# Patient Record
Sex: Male | Born: 2005 | Race: Black or African American | Hispanic: No | Marital: Single | Smoking: Never smoker
Health system: Southern US, Community
[De-identification: ages and names within clinical notes are randomized; demographics above are authoritative.]

## PROBLEM LIST (undated history)

## (undated) DIAGNOSIS — L309 Dermatitis, unspecified: Secondary | ICD-10-CM

## (undated) DIAGNOSIS — J45909 Unspecified asthma, uncomplicated: Secondary | ICD-10-CM

## (undated) DIAGNOSIS — T50901A Poisoning by unspecified drugs, medicaments and biological substances, accidental (unintentional), initial encounter: Secondary | ICD-10-CM

## (undated) DIAGNOSIS — F209 Schizophrenia, unspecified: Secondary | ICD-10-CM

## (undated) HISTORY — DX: Poisoning by unspecified drugs, medicaments and biological substances, accidental (unintentional), initial encounter: T50.901A

## (undated) HISTORY — DX: Schizophrenia, unspecified: F20.9

---
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
## 2024-01-04 ENCOUNTER — Encounter: Payer: Self-pay | Admitting: Child and Adolescent Psychiatry

## 2024-01-04 ENCOUNTER — Ambulatory Visit (INDEPENDENT_AMBULATORY_CARE_PROVIDER_SITE_OTHER): Payer: MEDICAID | Admitting: Child and Adolescent Psychiatry

## 2024-01-04 ENCOUNTER — Other Ambulatory Visit: Payer: Self-pay

## 2024-01-04 DIAGNOSIS — F209 Schizophrenia, unspecified: Secondary | ICD-10-CM

## 2024-01-04 MED ORDER — BENZTROPINE MESYLATE 0.5 MG PO TABS
ORAL_TABLET | ORAL | 1 refills | Status: DC
Start: 2024-01-04 — End: 2024-02-16

## 2024-01-04 MED ORDER — HYDROXYZINE PAMOATE 25 MG PO CAPS: 25.0000 mg | ORAL_CAPSULE | Freq: Every day | ORAL | 1 refills | Status: DC

## 2024-01-04 MED ORDER — RISPERIDONE 3 MG PO TABS: ORAL_TABLET | ORAL | 1 refills | Status: DC

## 2024-01-04 NOTE — Progress Notes (Signed)
 [facility] MD/PA/NP OP Progress Note  01/04/24 3:30 PM Peter Ross  MRN:  [id number]  Chief Complaint: Medication management follow-up. Chief Complaint   Follow-up      HPI:   This is an 18 year old male, domiciled with biological mother, younger sibling and aunt; 12th grader; with medical history significant of bronchial asthma and bronchitis; and psychiatric history Cannabis abuse and psychiatric diagnosis of Schizophrenia, and hx of previous legal charges, presented today to for medication management follow-up and was accompanied with his mother.  Previously per court mandated psychological eval he was diagnosed with schizophreniform disorder and cannabis abuse in 11/2020. He was in juvenile detention facility from May to October and subsequently seen in ER for medication refills, referred to this clinic for psychiatric medication management in 06/2022.   After his appointment in 07/2021,  court ordered for capacity restoration for all his charges that occurred in 2022, subsequently mother brought pt to [facility] per court order.  He was subsequently diagnosed with schizophrenia unspecified type.  During the hospitalization at [facility] he has medications were titrated up, he was discharged on Risperdal  3 mg in the morning and 5 mg at night(increased on September 24, 2021), hydroxyzine  50 mg twice a day, Cogentin  0.5 mg twice a day started on May 18/2023, and BuSpar  10 mg twice a day decreased on August 22, 2021.  He is currently prescribed Risperdal  3 mg twice daily, Cogentin  0.5 mg twice daily and hydroxyzine  25 mg daily at bedtime.  Today he was accompanied by his program and was evaluated alone and jointly with his girlfriend.  He provided verbal informed consent to speak with his cousin to obtain collateral information and discuss treatment plan.  Dontre denied any new concerns for today's appointment, he reported that he has been doing good, graduated high school and now working 38 hours a week.   He reported that work has been going well, he feels energetic in the morning however with his medication he does get tired.  He reported that they ran out of the medications for about 2 or 3 days and his mother complained that he was not acting like when he is on medications, patient reported that he did not see any difference however has been back on the medication and has been taking it consistently.  Psychoeducation was provided on medication adherence and he was receptive to this.  He denied any problems with his mood, denied AVH, did not admit any delusions, denied SI or HI and reported that he likes his work, and in his free time he listens to music for the laxatives at home.  He denied any substance abuse.  His cousin reported that overall he has been doing well, his mother commented that he was not acting as usual when he was off the medications, they quickly realized it and he understands to take his medications daily.  We discussed to continue with current medications because of the stability with his symptoms.  His last ANC was 1000 last month, it has been overall stable ranging from 1000-1300, we will consider ordering it at the next appointment.   We also discussed, that I will be transitioning from the clinic, and will only have one day at the clinic, therefore it will not be possible for me to continue to see them frequently, gave a back up appointment and reached out to [facility] clinic to see if he can be transitioned there.  Discussed this with patient and they verbalized understanding and agreed with this.  Visit Diagnosis:    ICD-10-CM   1. Schizophrenia, unspecified type (HCC)  F20.9 benztropine  (COGENTIN ) 0.5 MG tablet    risperiDONE  (RISPERDAL ) 3 MG tablet              Past Psychiatric History:   He had numerous ER visit before April of 2022.  No previous outpatient psychiatric treatment. He received psychiatric treatment while he was in juvenile detention center  according to mother and was diagnosed and treated for schizophrenia by a psychiatrist according to mother. He was admitted to [facility] between February 2023 to June 2023 for a total of 5 months for capacity restoration ordered by court, subsequently was discharged as all the charges were dropped. He has tried haloperidol 1 mg along with Risperdal , was discontinued because of dropping absolute neutrophil count.  He also tried Thorazine 50 mg along with Risperdal  and was also discontinued because of drop in absolute neutrophil count. They also had concerns of benign ethnic neutropenia for patient while he was at [facility].  Before initiating Risperdal  in 10/2020 during his ER visit at [facility] his ANC was 1.4K and 1.3K. Records from [facility] suggest that on admission prior to starting risperdal   On 02/03 ANC was 1.2 On 04/04 1.09 on a higher dose of Risperdal  04/11 - 1.14 05/02 - 0.95 on thorazine 05/04 - 0.85 stopped thorazine 05/08 - 1.86 05/30 - 0.77(Haldol was started on 05/18 and therefore it was stopped on 05/30 06/01 - 1.5 05/14/2022 - 1.1 06/30/22 - 1.5 10/27/21 - 1.4 with WBC of 3.7 08/06 - WBC of 2.9 with ANC of 900 08/08 - WBC of 3.4 with ANC of 1.3 09/11 - WBC of 3.2 with ANC of 1.0 10/03 -  WBC of 3.6 and ANC of 1.1 1/28 - WBC of 3.4 and ANC of 1.3 11/24/23 - WBC of 3.1 and ANC of 1.0   I previously discussed these findings with mother and dicussed with mother that 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  We discussed to continue to monitor, repeat CBC intermittently.  He has seen primary care previously.   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 No pertinent surgical history.  Family Psychiatric History:   Mother reports that father's side of the family has history significant of psychosis. Mother reports that from her side of the family, history significant of depression.  Family History:  Family History  Problem Relation Age of Onset   Asthma Mother     Social History:  Social History   Socioeconomic History   Marital status: Single    Spouse name: Not on file   Number of children: Not on file   Years of education: Not on file   Highest education level: 12th grade  Occupational History   Occupation: Production manager  Tobacco Use   Smoking status: Never    Passive exposure: Yes   Smokeless tobacco: Never  Vaping Use   Vaping status: Never Used  Substance and Sexual Activity   Alcohol use: Never   Drug use: Never   Sexual activity: Never  Other Topics Concern  Not on file  Social History Narrative   ** Merged History Encounter **       Social Drivers of Health   Financial Resource Strain: Not on File (03/31/2023)   Received from [employer]    Financial Resource Strain: 0  Food Insecurity: Not at Risk (05/11/2023)   Received from [employer]    Food: 1  Transportation Needs: Not at Risk (05/11/2023)   Received from [employer] Needs    Transportation: 1  Physical Activity: Not on File (03/31/2023)   Received from [facility]   Physical Activity    Physical Activity: 0  Stress: Not on File (03/31/2023)   Received from [facility]   Stress    Stress: 0  Social Connections: Not on File (03/31/2023)   Received from [facility]   Social Connections    Connectedness: 0    Allergies:  Allergies  Allergen Reactions   Shellfish Allergy Hives, Itching and Swelling    Metabolic Disorder Labs: Lab Results  Component Value Date   HGBA1C 5.4 11/23/2023   MPG 111.15 10/30/2020   Lab Results  Component Value Date   PROLACTIN  25.2 11/23/2023   PROLACTIN 34.0 (H) 02/17/2023   Lab Results  Component Value Date   CHOL 129 11/23/2023   TRIG 27 11/23/2023   HDL 65 11/23/2023   CHOLHDL 2.0 11/23/2023   VLDL 5 10/30/2020   LDLCALC 56 11/23/2023   LDLCALC 54 02/17/2023   Lab Results  Component Value Date   TSH 0.936 05/14/2022   TSH 0.603 10/30/2020    Therapeutic Level Labs: No results found for: LITHIUM No results found for: VALPROATE No results found for: CBMZ  Current Medications: Current Outpatient Medications  Medication Sig Dispense Refill   benztropine  (COGENTIN ) 0.5 MG tablet TAKE 1 TABLET(0.5 MG) BY MOUTH TWICE DAILY 180 tablet 1   fluticasone (FLONASE) 50 MCG/ACT nasal spray Place 1 spray into both nostrils daily.     hydrOXYzine  (VISTARIL ) 25 MG capsule Take 1 capsule (25 mg total) by mouth at bedtime. 90 capsule 1   risperiDONE  (RISPERDAL ) 3 MG tablet TAKE 1 TABLET(3 MG) BY MOUTH TWICE DAILY 180 tablet 1   No current facility-administered medications for this visit.     Musculoskeletal:  Gait & Station: normal Patient leans: N/A  Psychiatric Specialty Exam: Review of Systems  Blood pressure 113/73, pulse (!) 57, temperature (!) 97.5 F (36.4 C), temperature source Temporal, height 5' 9.5 (1.765 m), weight 149 lb 9.6 oz (67.9 kg).Body mass index is 21.78 kg/m.  General Appearance: Casual and Fairly Groomed  Eye Contact:  Fair  Speech:  Clear and Coherent and Normal Rate  Volume:  Normal  Mood:  good..  Affect:  Appropriate, Congruent, and Full Range   Thought Process:  Goal Directed and Linear  Orientation:  Full (Time, Place, and Person)  Thought Content: Logical   Suicidal Thoughts:  No  Homicidal Thoughts:  No  Memory:  Immediate;   Fair Recent;   Fair Remote;   Fair  Judgement:  Fair  Insight:  Fair  Psychomotor Activity:  Normal  Concentration:  Concentration: Fair and Attention Span: Fair  Recall:  [employer] of Knowledge: Fair  Language: Fair   Akathisia:  No    AIMS (if indicated): done on 10/08/23 and negative  Assets:  Communication Skills Desire for Improvement Financial Resources/Insurance Housing Leisure Time Physical Health Social Support Transportation Vocational/Educational  ADL's:  Intact  Cognition:  WNL  Sleep:   Fair   Screenings: GAD-7    Flowsheet Row Office Visit from 06/30/2022 in [facility] Office Visit from 04/02/2022 in [facility] Office Visit from 02/20/2022 in [facility]  Total GAD-7 Score 0 2 1   PHQ2-9    Flowsheet Row Office Visit from 09/08/2023 in [facility] ED from 09/24/2022 in [facility] Office Visit from 06/30/2022 in [facility] Office Visit from 04/02/2022 in [facility] Office Visit from 02/20/2022 in [facility]  PHQ-2 Total Score 1 1 0 0 5  PHQ-9 Total Score -- -- 0 0 11   Flowsheet Row ED from 09/24/2022 in [facility] Office Visit from 04/02/2022 in [facility] ED from 10/30/2020 in [facility]  C-SSRS RISK CATEGORY High Risk No Risk Moderate Risk     Assessment and Plan:   18 year old male with prior psychiatric history of Schizophrenia and 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     His mother describes him as a typically growing child until about 1 to 2 years ago when she started noticing change in his behaviors such as becoming agitated, physically aggressive towards her, elopement from home, complaining about hearing voices and seeing things.  His presentation appeared most likely consistent with schizophrenia in the context of genetic predisposition and his past  use of Cannabis.   He was discharged on Risperdal  3 mg twice a day, BuSpar  30 mg twice a day, hydroxyzine  50 mg 3 times a day with overall stability in his symptoms.  At his initial evaluation they ran out of the medication for about 7 days therefore he was recommended to start Risperdal  at 1.5 mg twice a day and BuSpar  15 mg twice a day with a plan to go back to his previous dose of Risperdal  3 mg twice a day and BuSpar  30 mg twice a day.  However mother decided to keep him on Risperdal  1.5 mg twice a day and BuSpar  15 mg twice a day because of excessive sedation on subsequent follow up. He was then admitted to [facility] for capacity restoration where he was stabilized with Risperdal  3 mg in AM and 5 mg at bedtime, Buspar  10 mg BID, Atarax  50 mg BID and Cogentin  0.5 mg BID.  Over the time here is Risperdal  was decreased to 3 mg twice daily, discontinued BuSpar , hydroxyzine  was decreased to 25 mg at night and Cogentin  was continued with 0.5 mg twice daily.  Update on 01/04/24 -reviewed response to his current medications and he appears to have continued stability with his psychiatric symptoms.  He did not notice any worsening of anxiety since discontinuation of BuSpar , continues to do well with Risperdal  and Cogentin .    Plan is mentioned below.   1. Schizophrenia, unspecified type (HCC) - Continue with Risperdal  3 mg twice daily .  - Take Cogentin (Benzatropine) 0.5 mg twice a day   2. Anxiety disorder, unspecified type - Continue with atarax  25 mg daily at bedtime.   -from 11/24/23 - CBC within normal limits except WBC of 3.1 and ANC of 1.0, CMP within normal limits, hemoglobin A1c is 5.4, lipid panel within normal limit         This note was generated in part or whole with voice recognition software. Voice  recognition is usually quite accurate but there are transcription errors that can and very often do occur. I apologize for any typographical errors that were not detected and  corrected.           Shelton CHRISTELLA Marek, MD 01/04/2024, 3:09 PM

## 2024-02-16 ENCOUNTER — Other Ambulatory Visit: Payer: Self-pay | Admitting: Child and Adolescent Psychiatry

## 2024-02-16 DIAGNOSIS — F209 Schizophrenia, unspecified: Secondary | ICD-10-CM

## 2024-02-23 ENCOUNTER — Ambulatory Visit (HOSPITAL_COMMUNITY): Payer: MEDICAID | Admitting: Psychiatry

## 2024-03-01 ENCOUNTER — Ambulatory Visit (HOSPITAL_COMMUNITY): Payer: MEDICAID | Admitting: Psychiatry

## 2024-03-15 ENCOUNTER — Ambulatory Visit (HOSPITAL_COMMUNITY): Payer: MEDICAID | Admitting: Psychiatry

## 2024-04-12 ENCOUNTER — Ambulatory Visit (INDEPENDENT_AMBULATORY_CARE_PROVIDER_SITE_OTHER): Payer: MEDICAID | Admitting: Psychiatry

## 2024-04-12 ENCOUNTER — Encounter (HOSPITAL_COMMUNITY): Payer: Self-pay | Admitting: Psychiatry

## 2024-04-12 VITALS — BP 110/68 | HR 64 | Ht 69.0 in | Wt 148.8 lb

## 2024-04-12 DIAGNOSIS — F209 Schizophrenia, unspecified: Secondary | ICD-10-CM

## 2024-04-12 DIAGNOSIS — F1291 Cannabis use, unspecified, in remission: Secondary | ICD-10-CM | POA: Diagnosis not present

## 2024-04-12 DIAGNOSIS — F418 Other specified anxiety disorders: Secondary | ICD-10-CM

## 2024-04-12 MED ORDER — HYDROXYZINE PAMOATE 25 MG PO CAPS: 25.0000 mg | ORAL_CAPSULE | Freq: Every day | ORAL | 0 refills | Status: DC

## 2024-04-12 NOTE — Progress Notes (Signed)
 [facility] MD/PA/NP OP Progress Note  04/12/2024 11:20 AM Peter Ross  MRN:  [id number]  Chief Complaint:  Chief Complaint  Patient presents with   Establish Care    Transfer care , schizophrenia   HPI: Patient being transferred from Dr. Susen. Chart reviewed and as per review  This is an 18 year old male, domiciled with biological mother, younger sibling and aunt; 12th grader; with medical history significant of bronchial asthma and bronchitis; and psychiatric history Cannabis abuse and psychiatric diagnosis of Schizophrenia, and hx of previous legal charges, presented today to for medication management follow-up and was accompanied with his mother.   Previously per court mandated psychological eval he was diagnosed with schizophreniform disorder and cannabis abuse in 11/2020. He was in juvenile detention facility from May to October and subsequently seen in ER for medication refills, referred to this clinic for psychiatric medication management in 06/2022.    Court ordered for capacity restoration for all his charges that occurred in 2022, subsequently mother brought pt to [facility] per court order.  He was subsequently diagnosed with schizophrenia unspecified type.  During the hospitalization at [facility] he has medications were titrated up, he was discharged on Risperdal  3 mg in the morning and 5 mg at night(increased on September 24, 2021), hydroxyzine  50 mg twice a day, Cogentin  0.5 mg twice a day started on May 18/2023, and BuSpar  10 mg twice a day decreased on August 22, 2021.   He is currently prescribed Risperdal  3 mg twice daily, Cogentin  0.5 mg twice daily and hydroxyzine  25 mg daily at bedtime He was admitted to [facility] between February 2023 to June 2023 for a total of 5 months for capacity restoration ordered by court, subsequently was discharged as all the charges were dropped. He has tried haloperidol 1 mg along with Risperdal , was discontinued because of dropping absolute neutrophil count.  He also  tried Thorazine 50 mg along with Risperdal  and was also discontinued because of drop in absolute neutrophil count. 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  labs with WBC done and in chart  08/06 - WBC of 2.9 with ANC of 900 08/08 - WBC of 3.4 with ANC of 1.3 09/11 - WBC of 3.2 with ANC of 1.0 10/03 -  WBC of 3.6 and ANC of 1.1 1/28 - WBC of 3.4 and ANC of 1.3 11/24/23 - WBC of 3.1 and ANC of 1.0    As per review His mother describes him as a typically growing child until about 1 to 2 years ago when she started noticing change in his behaviors such as becoming agitated, physically aggressive towards her, elopement from home, complaining about hearing voices and seeing things.  His presentation appeared most likely consistent with schizophrenia in the context of genetic predisposition and his past use of Cannabis   On evaluation today patient is doing reasonable he is not paranoid sitting calmly answering questions he has started work 30+ hours.  He has finished high school.  Describes not to be hallucinating does not endorse hallucinations or confusion he is not agitated does not endorse thoughts about harming himself or anybody else.  He believes the medication helping him there is no reported side effects no tremors or involuntary movements are noticeable.  Case discussed with mom and mom agrees with the treatment plan of continuing the current medication but adding something for sleep he has some difficulty sleeping or maintaining sleep despite being on respite all at night 3  mg. Patient does not endorse excessive anxiety he has regular anxiety that he feels is adequately controlled with hydroxyzine   He is also taking Cogentin  twice a day for possible work to prevent any side effects  There is no associated manic symptoms or panic attacks He describes his depression to be stable does not endorse depression or sadness on a day-to-day basis  He has  not relapsed and does not endorse any craving and is following up with compliance with the current medication regimen has a good support system Aggravating factors; past use of marijuana and admission.  Modifying factors supportive mom, current work  Severity improving  Duration more than 3 years  Current drug use denies for the last 2+ years    Visit Diagnosis:    ICD-10-CM   1. Schizophrenia, unspecified type (HCC)  F20.9     2. Other specified anxiety disorders  F41.8     3. 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 No pertinent surgical history.  Family Psychiatric History: see chart   Family History:  Family History  Problem Relation Age of Onset   Asthma Mother     Social History:  Social History   Socioeconomic History   Marital status: Single    Spouse name: Not on file   Number of children: Not on file   Years of education: Not on file   Highest education level: 12th grade  Occupational History   Occupation: Production manager  Tobacco Use   Smoking status: Never    Passive exposure: Yes   Smokeless tobacco: Never  Vaping Use   Vaping status: Never Used  Substance and Sexual Activity   Alcohol use: Never   Drug use: Never   Sexual activity: Never  Other Topics Concern   Not on file  Social History Narrative   ** Merged History Encounter **       Social Drivers of Health   Financial Resource Strain: Not on File (03/31/2023)   Received from [employer]    Financial  Resource Strain: 0  Food Insecurity: Not at Risk (05/11/2023)   Received from [facility] Insecurity    Within the past 12 months, the food you bought just didn't last and you didn't have enough money to get more.: 1  Transportation Needs: Not at Risk (05/11/2023)   Received from [employer] Needs    In the past 12 months, has lack of transportation kept you from medical appointments, meetings, work or from getting things needed for daily living? (Check all that apply): 1  Physical Activity: Not on File (03/31/2023)   Received from [facility]   Physical Activity    Physical Activity: 0  Stress: Not on File (03/31/2023)   Received from [facility]   Stress    Stress: 0  Social Connections: Not on File (03/31/2023)   Received from [facility]   Social Connections    Connectedness: 0    Allergies:  Allergies  Allergen  Reactions   Shellfish Allergy Hives, Itching and Swelling    Metabolic Disorder Labs: Lab Results  Component Value Date   HGBA1C 5.4 11/23/2023   MPG 111.15 10/30/2020   Lab Results  Component Value Date   PROLACTIN 25.2 11/23/2023   PROLACTIN 34.0 (H) 02/17/2023   Lab Results  Component Value Date   CHOL 129 11/23/2023   TRIG 27 11/23/2023   HDL 65 11/23/2023   CHOLHDL 2.0 11/23/2023   VLDL 5 10/30/2020   LDLCALC 56 11/23/2023   LDLCALC 54 02/17/2023   Lab Results  Component Value Date   TSH 0.936 05/14/2022   TSH 0.603 10/30/2020    Therapeutic Level Labs: No results found for: LITHIUM No results found for: VALPROATE No results found for: CBMZ  Current Medications: Current Outpatient Medications  Medication Sig Dispense Refill   benztropine  (COGENTIN ) 0.5 MG tablet TAKE 1 TABLET(0.5 MG) BY MOUTH TWICE DAILY 180 tablet 1   fluticasone (FLONASE) 50 MCG/ACT nasal spray Place 1 spray into both nostrils daily.     hydrOXYzine  (VISTARIL ) 25 MG capsule Take 1 capsule (25 mg total) by mouth at bedtime. (Patient taking differently: Take 25 mg by  mouth 2 (two) times daily.) 90 capsule 1   risperiDONE  (RISPERDAL ) 3 MG tablet TAKE 1 TABLET(3 MG) BY MOUTH TWICE DAILY 180 tablet 1   No current facility-administered medications for this visit.     Psychiatric Specialty Exam: Review of Systems  Cardiovascular:  Negative for chest pain.  Neurological:  Negative for tremors.  Psychiatric/Behavioral:  Positive for sleep disturbance. Negative for suicidal ideas.     Blood pressure 110/68, pulse 64, height 5' 9 (1.753 m), weight 148 lb 12.8 oz (67.5 kg), SpO2 99%.Body mass index is 21.97 kg/m.  General Appearance: Casual  Eye Contact:  Fair  Speech:  Clear and Coherent  Volume:  Decreased  Mood:  Euthymic  Affect:  Congruent  Thought Process:  Goal Directed  Orientation:  Full (Time, Place, and Person)  Thought Content: Logical   Suicidal Thoughts:  No  Homicidal Thoughts:  No  Memory:  Immediate;   Fair  Judgement:  Fair  Insight:  Shallow  Psychomotor Activity:  Normal  Concentration:  Concentration: Fair  Recall:  Fair  Fund of Knowledge: Good  Language: Good  Akathisia:  No  Handed:    AIMS (if indicated): no involuntary movements  Assets:  Desire for Improvement Physical Health Social Support  ADL's:  Intact  Cognition: WNL  Sleep:  variable    Screenings: GAD-7    Loss adjuster, chartered Office Visit from 04/12/2024 in [facility] Office Visit from 06/30/2022 in [facility] Office Visit from 04/02/2022 in [facility] Office Visit from 02/20/2022 in [facility]  Total GAD-7 Score 1 0 2 1   PHQ2-9    Flowsheet Row Office Visit from 04/12/2024 in [facility] Office Visit from 09/08/2023 in [facility] [place] ED from 09/24/2022 in [facility]  Office Visit from 06/30/2022 in [facility] Office Visit from 04/02/2022 in [facility]  PHQ-2 Total Score 0 1 1 0 0  PHQ-9 Total Score 4 -- -- 0 0   Flowsheet Row Office Visit from 04/12/2024 in [facility] ED from 09/24/2022 in [facility] Office Visit from 04/02/2022 in [facility]  C-SSRS RISK CATEGORY No Risk High Risk No Risk     Assessment and Plan: as follows  Schizophrenia unspecified type; Baseline is not hallucinating.  We will continue Risperdal  3 mg twice a day and also Cogentin  for the possible side effects Labs reviewed we will follow-up with WBC with differential considering there has been a possible benign neutropenia Reviewed sleep hygiene can take hydroxyzine  at night or extra 1 if needed  Labs :  -from 11/24/23 - CBC within normal limits except WBC of 3.1 and ANC of 1.0, CMP within normal limits, hemoglobin A1c is 5.4, lipid panel within normal limit   Other specified anxiety disorder; manageable with hydroxyzine  continue work on distraction from negative thoughts  Cannabis use disorder; remains in remission for last 2+ years does not endorse craving or any relapse understands the use may exacerbate any paranoia or decompensate his mental health  Reviewed medications questions addressed case discussed with mom labs request sent medication refills will be was sent  Follow-up in 2 months or earlier if needed  Direct care time spent 45 min including chart review, face to face, labs and documentation, collaboration if any  Collaboration of Care: Collaboration of Care: Psychiatrist AEB chart and Dr. Susen notes reviewed   Patient/Guardian was advised Release of Information must be obtained prior to any record release in order to collaborate their care with an outside  provider. Patient/Guardian was advised if they have not already done so to contact the registration department to sign all necessary forms in order for us  to release information regarding their care.   Consent: Patient/Guardian gives verbal consent for treatment and assignment of benefits for services provided during this visit. Patient/Guardian expressed understanding and agreed to proceed.    Jackey Flight, MD 04/12/2024, 11:20 AM

## 2024-04-13 LAB — CBC WITH DIFFERENTIAL/PLATELET
Basophils Absolute: 0 x10E3/uL (ref 0.0–0.2)
Basos: 1 %
EOS (ABSOLUTE): 0.2 x10E3/uL (ref 0.0–0.4)
Eos: 5 %
Hematocrit: 48 % (ref 37.5–51.0)
Hemoglobin: 15.6 g/dL (ref 13.0–17.7)
Immature Grans (Abs): 0 x10E3/uL (ref 0.0–0.1)
Immature Granulocytes: 0 %
Lymphocytes Absolute: 1.5 x10E3/uL (ref 0.7–3.1)
Lymphs: 56 %
MCH: 28.2 pg (ref 26.6–33.0)
MCHC: 32.5 g/dL (ref 31.5–35.7)
MCV: 87 fL (ref 79–97)
Monocytes Absolute: 0.2 x10E3/uL (ref 0.1–0.9)
Monocytes: 7 %
Neutrophils Absolute: 0.9 x10E3/uL — ABNORMAL LOW (ref 1.4–7.0)
Neutrophils: 31 %
Platelets: 254 x10E3/uL (ref 150–450)
RBC: 5.53 x10E6/uL (ref 4.14–5.80)
RDW: 13.2 % (ref 11.6–15.4)
WBC: 2.8 x10E3/uL — ABNORMAL LOW (ref 3.4–10.8)

## 2024-04-25 ENCOUNTER — Ambulatory Visit: Payer: MEDICAID | Admitting: Child and Adolescent Psychiatry

## 2024-06-14 ENCOUNTER — Ambulatory Visit (HOSPITAL_COMMUNITY): Payer: MEDICAID | Admitting: Psychiatry

## 2024-07-05 ENCOUNTER — Ambulatory Visit (HOSPITAL_COMMUNITY): Payer: MEDICAID | Admitting: Psychiatry

## 2024-07-28 ENCOUNTER — Other Ambulatory Visit (HOSPITAL_COMMUNITY): Payer: Self-pay | Admitting: Psychiatry

## 2024-08-09 ENCOUNTER — Ambulatory Visit (HOSPITAL_COMMUNITY): Payer: MEDICAID | Admitting: Psychiatry

## 2024-09-27 ENCOUNTER — Ambulatory Visit (HOSPITAL_COMMUNITY): Payer: MEDICAID | Admitting: Psychiatry
